# Patient Record
Sex: Female | Born: 1980 | Race: White | Hispanic: No | Smoking: Never smoker
Health system: Southern US, Community
[De-identification: ages and names within clinical notes are randomized; demographics above are authoritative.]

## PROBLEM LIST (undated history)

## (undated) DIAGNOSIS — E079 Disorder of thyroid, unspecified: Secondary | ICD-10-CM

## (undated) DIAGNOSIS — F988 Other specified behavioral and emotional disorders with onset usually occurring in childhood and adolescence: Secondary | ICD-10-CM

## (undated) HISTORY — DX: Other specified behavioral and emotional disorders with onset usually occurring in childhood and adolescence: F98.8

## (undated) HISTORY — PX: ABDOMINAL SURGERY: SHX537

## (undated) HISTORY — PX: CHOLESTEATOMA EXCISION: SHX1345

## (undated) HISTORY — PX: APPENDECTOMY: SHX54

---

## 2012-02-03 HISTORY — PX: TYMPANOSTOMY TUBE PLACEMENT: SHX32

## 2015-05-09 DIAGNOSIS — E039 Hypothyroidism, unspecified: Secondary | ICD-10-CM | POA: Insufficient documentation

## 2015-05-09 DIAGNOSIS — R87810 Cervical high risk human papillomavirus (HPV) DNA test positive: Secondary | ICD-10-CM | POA: Insufficient documentation

## 2015-05-30 DIAGNOSIS — Z8619 Personal history of other infectious and parasitic diseases: Secondary | ICD-10-CM | POA: Insufficient documentation

## 2017-07-20 ENCOUNTER — Other Ambulatory Visit: Payer: Self-pay

## 2017-07-20 ENCOUNTER — Encounter (HOSPITAL_COMMUNITY): Payer: Self-pay | Admitting: Emergency Medicine

## 2017-07-20 ENCOUNTER — Emergency Department (HOSPITAL_COMMUNITY): Payer: Commercial Managed Care - PPO

## 2017-07-20 ENCOUNTER — Emergency Department (HOSPITAL_COMMUNITY)
Admission: EM | Admit: 2017-07-20 | Discharge: 2017-07-21 | Disposition: A | Discharge status: Home / Self Care | Payer: Commercial Managed Care - PPO | Attending: Emergency Medicine | Admitting: Emergency Medicine

## 2017-07-20 DIAGNOSIS — R51 Headache: Secondary | ICD-10-CM | POA: Diagnosis not present

## 2017-07-20 DIAGNOSIS — E079 Disorder of thyroid, unspecified: Secondary | ICD-10-CM | POA: Insufficient documentation

## 2017-07-20 DIAGNOSIS — R519 Headache, unspecified: Secondary | ICD-10-CM

## 2017-07-20 DIAGNOSIS — H5789 Other specified disorders of eye and adnexa: Secondary | ICD-10-CM | POA: Diagnosis present

## 2017-07-20 HISTORY — DX: Disorder of thyroid, unspecified: E07.9

## 2017-07-20 LAB — BASIC METABOLIC PANEL
Anion gap: 11 (ref 5–15)
BUN: 18 mg/dL (ref 6–20)
CO2: 23 mmol/L (ref 22–32)
Calcium: 9.9 mg/dL (ref 8.9–10.3)
Chloride: 103 mmol/L (ref 101–111)
Creatinine, Ser: 0.9 mg/dL (ref 0.44–1.00)
GFR calc non Af Amer: >60 mL/min (ref >60)
Glucose, Bld: 87 mg/dL (ref 65–99)
Potassium: 4 mmol/L (ref 3.5–5.1)
SODIUM: 137 mmol/L (ref 135–145)

## 2017-07-20 LAB — CBC WITH DIFFERENTIAL/PLATELET
Abs Immature Granulocytes: 0 10*3/uL (ref 0.0–0.1)
BASOS PCT: 1 %
Basophils Absolute: 0.1 10*3/uL (ref 0.0–0.1)
Eosinophils Absolute: 0.1 10*3/uL (ref 0.0–0.7)
Eosinophils Relative: 1 %
HCT: 46.2 % — ABNORMAL HIGH (ref 36.0–46.0)
Hemoglobin: 15.6 g/dL — ABNORMAL HIGH (ref 12.0–15.0)
IMMATURE GRANULOCYTES: 0 %
Lymphocytes Relative: 23 %
Lymphs Abs: 2.2 10*3/uL (ref 0.7–4.0)
MCH: 30.7 pg (ref 26.0–34.0)
MCHC: 33.8 g/dL (ref 30.0–36.0)
MCV: 90.9 fL (ref 78.0–100.0)
MONOS PCT: 5 %
Monocytes Absolute: 0.5 10*3/uL (ref 0.1–1.0)
NEUTROS PCT: 70 %
Neutro Abs: 6.7 10*3/uL (ref 1.7–7.7)
PLATELETS: 226 10*3/uL (ref 150–400)
RBC: 5.08 MIL/uL (ref 3.87–5.11)
RDW: 12.3 % (ref 11.5–15.5)
WBC: 9.5 10*3/uL (ref 4.0–10.5)

## 2017-07-20 LAB — I-STAT BETA HCG BLOOD, ED (MC, WL, AP ONLY): I-stat hCG, quantitative: <5 m[IU]/mL (ref <5)

## 2017-07-20 MED ORDER — ACETAMINOPHEN 325 MG PO TABS
325.0000 mg | ORAL_TABLET | Freq: Once | ORAL | Status: AC
  Administered 2017-07-20: 325 mg via ORAL
  Filled 2017-07-20: qty 1

## 2017-07-20 MED ORDER — GADOBENATE DIMEGLUMINE 529 MG/ML IV SOLN
20.0000 mL | Freq: Once | INTRAVENOUS | Status: AC | PRN
  Administered 2017-07-20: 20 mL via INTRAVENOUS

## 2017-07-20 NOTE — ED Notes (Signed)
Pt states she takes Tylenol safely at home. Orders placed.

## 2017-07-20 NOTE — ED Triage Notes (Signed)
Patient sent here from eye dr after her regular check up. Patient has note from eye dr stating that he would like her to be worked up for a central venous sinus thrombosis. Patient c/o of HA, no blurred vision and no other symptoms at this time.

## 2017-07-20 NOTE — ED Provider Notes (Signed)
[facility] EMERGENCY DEPARTMENT Provider Note   CSN: [id number] Arrival date & time: 07/20/17  1759     History   Chief Complaint Chief Complaint  Patient presents with  . Eye Problem    HPI Cathy Franklin is a 37 y.o. female.  Patient with a history of hypothyroidism, recent right tympanostomy, presents with concern for bilateral eye problems after seen by her ophthalmologist earlier today. She went for a routine eye exam with her eye doctor. She denies visual changes, eye pain or headache when seen. She developed a mild headache after arrival in the ED. She was referred to Dr. 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  No recent fever, significant headaches, visual changes, eye pain, nausea, lateralizing weakness, paresthesias.   The history is provided by the patient. No language interpreter was used.  Eye Problem   Pertinent negatives include no photophobia.    Past Medical History:  Diagnosis Date  . Thyroid disease     There are no active problems to display for this patient.      OB History    Gravida  4   Para  4   Term  4   Preterm      AB      Living        SAB      TAB      Ectopic      Multiple      Live Births               Home Medications    Prior to Admission medications   Not on File    Family History No family history on file.  Social History Social History   Tobacco Use  . Smoking status: Never Smoker  . Smokeless tobacco: Never Used  Substance Use Topics  . Alcohol use: Never    Frequency: Never  . Drug use: Never     Allergies   Bupropion and Oxycodone-acetaminophen   Review of Systems Review of Systems  Constitutional: Negative for chills and fever.  HENT: Negative.   Eyes: Negative for photophobia, pain and visual disturbance.  Respiratory: Negative.   Cardiovascular: Negative.    Gastrointestinal: Negative.   Musculoskeletal: Negative.   Skin: Negative.   Neurological: Positive for headaches (Mild headache onset after arrival to ED).     Physical Exam Updated Vital Signs BP (!) 145/102 (BP Location: Right Arm)   Pulse 70   Temp 98.7 F (37.1 C) (Oral)   Resp 18   Ht 5\' 6"  (1.676 m)   Wt 95.3 kg (210 lb)   LMP  (LMP Unknown)   SpO2 99%   BMI 33.89 kg/m   Physical Exam  Constitutional: She is oriented to person, place, and time. She appears well-developed and well-nourished.  Eyes: Pupils are equal, round, and reactive to light. Conjunctivae are normal.  Disc margins hazy c/w edema.  Neck: Normal range of motion.  Pulmonary/Chest: Effort normal.  Neurological: She is alert and oriented to person, place, and time.  CN's 3-12 grossly intact. Speech is clear and focused. No facial asymmetry. No lateralizing weakness. Reflexes are equal. No deficits of coordination. Ambulatory without imbalance.    Skin: Skin is warm and dry.     ED Treatments / Results  Labs (all labs ordered are listed, but only abnormal results are displayed) Labs Reviewed  CBC WITH DIFFERENTIAL/PLATELET - Abnormal; Notable for the following components:      Result Value   Hemoglobin 15.6 (*)    HCT 46.2 (*)    All other components within normal limits  BASIC METABOLIC PANEL  I-STAT BETA HCG BLOOD, ED ([facility], [facility], [facility] ONLY)   Results for orders placed or performed during the hospital encounter of 07/20/17  Basic metabolic panel  Result Value Ref Range   Sodium 137 135 - 145 mmol/L   Potassium 4.0 3.5 - 5.1 mmol/L   Chloride 103 101 - 111 mmol/L   CO2 23 22 - 32 mmol/L   Glucose, Bld 87 65 - 99 mg/dL   BUN 18 6 - 20 mg/dL   Creatinine, Ser 1.61 0.44 - 1.00 mg/dL   Calcium 9.9 8.9 - 09.6 mg/dL   GFR calc non Af Amer >60 >60 mL/min   GFR calc Af Amer >60 >60 mL/min   Anion gap 11 5 - 15  CBC with Differential  Result Value Ref Range   WBC 9.5 4.0 - 10.5 K/uL   RBC 5.08  3.87 - 5.11 MIL/uL   Hemoglobin 15.6 (H) 12.0 - 15.0 g/dL   HCT 04.5 (H) 40.9 - 81.1 %   MCV 90.9 78.0 - 100.0 fL   MCH 30.7 26.0 - 34.0 pg   MCHC 33.8 30.0 - 36.0 g/dL   RDW 91.4 78.2 - 95.6 %   Platelets 226 150 - 400 K/uL   Neutrophils Relative % 70 %   Neutro Abs 6.7 1.7 - 7.7 K/uL   Lymphocytes Relative 23 %   Lymphs Abs 2.2 0.7 - 4.0 K/uL   Monocytes Relative 5 %   Monocytes Absolute 0.5 0.1 - 1.0 K/uL   Eosinophils Relative 1 %   Eosinophils Absolute 0.1 0.0 - 0.7 K/uL   Basophils Relative 1 %   Basophils Absolute 0.1 0.0 - 0.1 K/uL   Immature Granulocytes 0 %   Abs Immature Granulocytes 0.0 0.0 - 0.1 K/uL  I-Stat beta hCG blood, ED  Result Value Ref Range   I-stat hCG, quantitative <5.0 <5 mIU/mL   Comment 3             EKG None  Radiology Mr Laqueta Jean And Wo Contrast  Result Date: 07/20/2017 CLINICAL DATA:  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 EXAM: MRI HEAD AND ORBITS WITHOUT AND WITH CONTRAST MRV HEAD WITHOUT CONTRAST TECHNIQUE: Multiplanar, multiecho pulse sequences of the brain and surrounding structures were obtained without and with intravenous contrast. Multiplanar, multiecho pulse sequences of the orbits and surrounding structures were obtained including fat saturation techniques, before and after intravenous contrast administration. CONTRAST:  20mL MULTIHANCE GADOBENATE DIMEGLUMINE 529 MG/ML IV SOLN COMPARISON:  None. FINDINGS: MRI HEAD FINDINGS Brain: Cerebral volume within normal limits. No focal parenchymal signal abnormality. No abnormal foci of restricted diffusion to suggest acute or subacute ischemia. Gray-white matter differentiation maintained. No areas of chronic infarction. No acute or chronic intracranial hemorrhage. No mass lesion, midline shift or mass effect. No hydrocephalus. No extra-axial fluid collection. 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 Midline structures intact. No abnormal enhancement. Vascular: Major  intracranial vascular flow voids are well maintained. Skull and upper cervical spine: Craniocervical junction within normal limits. Upper cervical spine normal. Bone marrow signal intensity normal. No scalp soft tissue abnormality. Other: Trace fluid noted within the bilateral mastoid air cells, of doubtful significance. Inner ear structures normal. MRI ORBITS FINDINGS Orbits: Globes are symmetric in size with normal  appearance and morphology bilaterally. Optic nerves symmetric and within normal limits. No abnormal optic nerve edema or enhancement. Extra-ocular muscles within normal limits and symmetric in appearance. Superior orbital veins symmetric and within normal limits. Intraconal extraconal fat maintained. Normal lacrimal glands. No abnormality at the orbital apices. No abnormality about the cavernous sinus. Optic chiasm normally position within the suprasellar cistern and is normal in appearance. Visualized sinuses: Visualized sinuses are clear. Soft tissues: Periorbital soft tissues and visualized soft tissues of the face within normal limits. MRV FINDINGS Superior sagittal sinus widely patent to the level of the torcula. Transverse and sigmoid sinuses patent bilaterally as are the proximal internal jugular veins. Left transverse sinus is hypoplastic. Straight sinus, vein of Galen, and internal cerebral veins patent. No evidence for dural sinus thrombosis. No appreciable cortical vein thrombosis on SWI sequences. IMPRESSION: 1. Normal MRI of the brain and orbits. No acute abnormality identified. 2. Normal intracranial MRV.  No evidence for dural sinus thrombosis. Electronically Signed   By: Rise MuBenjamin  McClintock M.D.   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 EXAM: MRI HEAD AND ORBITS WITHOUT AND WITH CONTRAST MRV HEAD WITHOUT CONTRAST TECHNIQUE: Multiplanar, multiecho pulse sequences of the brain and  surrounding structures were obtained without and with intravenous contrast. Multiplanar, multiecho pulse sequences of the orbits and surrounding structures were obtained including fat saturation techniques, before and after intravenous contrast administration. CONTRAST:  20mL MULTIHANCE GADOBENATE DIMEGLUMINE 529 MG/ML IV SOLN COMPARISON:  None. FINDINGS: MRI HEAD FINDINGS Brain: Cerebral volume within normal limits. No focal parenchymal signal abnormality. No abnormal foci of restricted diffusion to suggest acute or subacute ischemia. Gray-white matter differentiation maintained. No areas of chronic infarction. No acute or chronic intracranial hemorrhage. No mass lesion, midline shift or mass effect. No hydrocephalus. No extra-axial fluid collection. 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 Midline structures intact. No abnormal enhancement. Vascular: Major intracranial vascular flow voids are well maintained. Skull and upper cervical spine: Craniocervical junction within normal limits. Upper cervical spine normal. Bone marrow signal intensity normal. No scalp soft tissue abnormality. Other: Trace fluid noted within the bilateral mastoid air cells, of doubtful significance. Inner ear structures normal. MRI ORBITS FINDINGS Orbits: Globes are symmetric in size with normal appearance and morphology bilaterally. Optic nerves symmetric and within normal limits. No abnormal optic nerve edema or enhancement. Extra-ocular muscles within normal limits and symmetric in appearance. Superior orbital veins symmetric and within normal limits. Intraconal extraconal fat maintained. Normal lacrimal glands. No abnormality at the orbital apices. No abnormality about the cavernous sinus. Optic chiasm normally position within the suprasellar cistern and is normal in appearance. Visualized sinuses: Visualized sinuses are clear. Soft tissues: Periorbital soft tissues and visualized soft tissues of the face within normal limits.  MRV FINDINGS Superior sagittal sinus widely patent to the level of the torcula. Transverse and sigmoid sinuses patent bilaterally as are the proximal internal jugular veins. Left transverse sinus is hypoplastic. Straight sinus, vein of Galen, and internal cerebral veins patent. No evidence for dural sinus thrombosis. No appreciable cortical vein thrombosis on SWI sequences. IMPRESSION: 1. Normal MRI of the brain and orbits. No acute abnormality identified. 2. Normal intracranial MRV.  No evidence for dural sinus thrombosis. Electronically Signed   By: Rise MuBenjamin  McClintock M.D.   On: 07/20/2017 22:33   Mr Rockwell GermanyOrbits W ZOWo Contrast  Result Date: 07/20/2017 CLINICAL DATA:  Initial evaluation for new  onset bilateral papilledema. EXAM: MRI HEAD AND ORBITS WITHOUT AND WITH CONTRAST MRV HEAD WITHOUT CONTRAST TECHNIQUE: Multiplanar, multiecho pulse sequences of the brain and surrounding structures were obtained without and with intravenous contrast. Multiplanar, multiecho pulse sequences of the orbits and surrounding structures were obtained including fat saturation techniques, before and after intravenous contrast administration. CONTRAST:  20mL MULTIHANCE GADOBENATE DIMEGLUMINE 529 MG/ML IV SOLN COMPARISON:  None. FINDINGS: MRI HEAD FINDINGS Brain: Cerebral volume within normal limits. No focal parenchymal signal abnormality. No abnormal foci of restricted diffusion to suggest acute or subacute ischemia. Gray-white matter differentiation maintained. No areas of chronic infarction. No acute or chronic intracranial hemorrhage. No mass lesion, midline shift or mass effect. No hydrocephalus. No extra-axial fluid collection. 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 Midline structures intact. No abnormal enhancement. Vascular: Major intracranial vascular flow voids are well maintained. Skull and upper cervical spine: Craniocervical junction within normal limits. Upper cervical spine normal. Bone marrow signal intensity  normal. No scalp soft tissue abnormality. Other: Trace fluid noted within the bilateral mastoid air cells, of doubtful significance. Inner ear structures normal. MRI ORBITS FINDINGS Orbits: Globes are symmetric in size with normal appearance and morphology bilaterally. Optic nerves symmetric and within normal limits. No abnormal optic nerve edema or enhancement. Extra-ocular muscles within normal limits and symmetric in appearance. Superior orbital veins symmetric and within normal limits. Intraconal extraconal fat maintained. Normal lacrimal glands. No abnormality at the orbital apices. No abnormality about the cavernous sinus. Optic chiasm normally position within the suprasellar cistern and is normal in appearance. Visualized sinuses: Visualized sinuses are clear. Soft tissues: Periorbital soft tissues and visualized soft tissues of the face within normal limits. MRV FINDINGS Superior sagittal sinus widely patent to the level of the torcula. Transverse and sigmoid sinuses patent bilaterally as are the proximal internal jugular veins. Left transverse sinus is hypoplastic. Straight sinus, vein of Galen, and internal cerebral veins patent. No evidence for dural sinus thrombosis. No appreciable cortical vein thrombosis on SWI sequences. IMPRESSION: 1. Normal MRI of the brain and orbits. No acute abnormality identified. 2. Normal intracranial MRV.  No evidence for dural sinus thrombosis. Electronically Signed   By: Rise Mu M.D.   On: 07/20/2017 22:33    Procedures Procedures (including critical care time)  Medications Ordered in ED Medications  gadobenate dimeglumine (MULTIHANCE) injection 20 mL (20 mLs Intravenous Contrast Given 07/20/17 2206)  acetaminophen (TYLENOL) tablet 325 mg (325 mg Oral Given 07/20/17 2304)     Initial Impression / Assessment and Plan / ED Course  I have reviewed the triage vital signs and the nursing notes.  Pertinent labs & imaging results that were available  during my care of the patient were reviewed by me and considered in my medical decision making (see chart for details).     Patient here after seeing ophthalmology earlier today with concern for optic edema, sent to r/o CVST and/or pseudotumor.   The patient denies significant headaches. No visual changes. She had MR's as suggested by ophtho (Dr. Sherryll Burger) which are negative.   The patient is seen by Dr. Bebe Shaggy. LP to r/o pseudotumor is not felt necessary based on exam. She is strongly encouraged to follow up with neurology and an ambulatory referral is provided.   The patient is felt appropriate for discharge home.   Final Clinical Impressions(s) / ED Diagnoses   Final diagnoses:  None   1. Headache    ED Discharge Orders    None  Elpidio Anis, PA-C 07/21/17 0013    Zadie Rhine, MD 07/21/17 0800

## 2017-07-20 NOTE — ED Provider Notes (Signed)
Patient placed in Quick Look pathway, seen and evaluated   Chief Complaint: eye problem  HPI:   Patient seen by ophthalmologist Dr. Sherryll BurgerShah earlier today for routine visit. 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 She had recent R ear surgery, there is also concern for mastoiditis, which in turn caused suspicion for CVST. Sends over for MR brain and orbits w/wo, MRV head and possible LP if all of that is negative. She reports headache at this time but that has been going on before the eye doctor evaluated her. Denies any vision changes, fever, pain behind ear, prior neurological issues.  ROS: eye problem  Physical Exam:   Gen: No distress  Neuro: Awake and Alert  Skin: Warm    Focused Exam: PERRL. Tube in R ear. No mastoid TTP.  Imaging ordered per ophtho recommendation. Basic labs ordered as well.   Initiation of care has begun. The patient has been counseled on the process, plan, and necessity for staying for the completion/evaluation, and the remainder of the medical screening examination    Dietrich PatesKhatri, Rheya Minogue, PA-C 07/20/17 1940    Abelino DerrickMackuen, Courteney Lyn, MD 07/20/17 2359

## 2017-07-21 NOTE — Discharge Instructions (Addendum)
Follow up with [facility] for further evaluation in the outpatient setting. Return here with any severe headache, uncontrolled nausea/vomiting, visual problems, or new concerns.

## 2017-07-21 NOTE — ED Provider Notes (Signed)
Patient seen/examined in the Emergency Department in conjunction with Midlevel Provider [facility] Patient presents for evaluation from ophthalmologist. Exam : Awake alert, no distress, no facial droop, no arm drift.  She appears comfortable Plan: Patient reports her headache started after getting her eyes dilated at the ophthalmologist.  No new vision changes at this time.  No focal weakness.  MRI imaging is negative.  Ophthalmologist had requested lumbar puncture for pseudotumor, but after long discussion with patient, she elects to defer this at this time.  She will follow-up with neurology in 1-2 weeks   At this point I feel this is appropriate, there is no emergent need for lumbar puncture at this time    Cathy Franklin, Cathy Runkel, MD 07/21/17 0008

## 2017-07-21 NOTE — ED Notes (Signed)
ED Provider at bedside. 

## 2017-07-22 NOTE — Progress Notes (Signed)
Cathy Franklin was seen today in neurologic consultation at the request of Zadie Rhine, MD.  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  Records have been reviewed, including emergency room records and records from her ophthalmologist at [facility], Dr. Sherryll Burger.  Patient was seen by him on July 20, 2017.  There was noted to be bilateral disc edema without disc hemorrhage.  OCT scanning was negative for intra-or subretinal fluid.  she thinks that she had VF testing at the optometrist.  She was referred to the emergency room after that visit.  Emergency room did MRI of the brain, MRV of the brain and MRI of the orbits.  I had the opportunity to review her scans.  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  They were done with contrast.  Emergency department records indicate that patient was offered lumbar puncture but declined per physician report, but PA who saw her stated "LP to rule out pseudotumor is not felt necessary based on exam."  Pt hasn't noted significant changes in vision.  She does work nights as a Engineer, civil (consulting) and knows that vision is not as good as it used to be.  She is on Vit D.  She is also on a MVI.  She has been on vyvanse for a year.  She was given a progestin challenge to initiate period and that was successful.  No birth control.  IUD out for over a year.  Weight has been stable for the last 2 years.     ALLERGIES:   Allergies  Allergen Reactions  . Bupropion Other (See Comments)    unknown  . Oxycodone-Acetaminophen Other (See Comments)    UNKNOWN    CURRENT MEDICATIONS:  Outpatient Encounter Medications as of 07/23/2017  Medication Sig  . cholecalciferol (VITAMIN D) 1000 units tablet Take 1,000 Units by mouth daily.  Marland Kitchen levothyroxine (SYNTHROID, LEVOTHROID) 75 MCG tablet Take 75 mcg by mouth daily.  Marland Kitchen lisdexamfetamine (VYVANSE) 30 MG  capsule Take 30 mg by mouth daily.  . Multiple Vitamin (MULTIVITAMIN) tablet Take 1 tablet by mouth daily.  . Probiotic Product (PROBIOTIC-10 PO) Take by mouth daily.   No facility-administered encounter medications on file as of 07/23/2017.     PAST MEDICAL HISTORY:   Past Medical History:  Diagnosis Date  . ADD (attention deficit disorder)   . Thyroid disease     PAST SURGICAL HISTORY:   Past Surgical History:  Procedure Laterality Date  . ABDOMINAL SURGERY    . APPENDECTOMY    . CHOLESTEATOMA EXCISION    . TYMPANOSTOMY TUBE PLACEMENT  2014    SOCIAL HISTORY:   Social History   Socioeconomic History  . Marital status: Divorced    Spouse name: Not on file  . Number of children: Not on file  . Years of education: Not on file  . Highest education level: Not on file  Occupational History  . Not on file  Social Needs  . Financial resource strain: Not on file  . Food insecurity:    Worry: Not on file    Inability: Not on file  . Transportation needs:    Medical: Not on file    Non-medical: Not on file  Tobacco Use  . Smoking status: Never Smoker  . Smokeless tobacco: Never Used  Substance and Sexual Activity  . Alcohol use: Never    Frequency: Never  .  Drug use: Never  . Sexual activity: Yes    Birth control/protection: None  Lifestyle  . Physical activity:    Days per week: Not on file    Minutes per session: Not on file  . Stress: Not on file  Relationships  . Social connections:    Talks on phone: Not on file    Gets together: Not on file    Attends religious service: Not on file    Active member of club or organization: Not on file    Attends meetings of clubs or organizations: Not on file    Relationship status: Not on file  . Intimate partner violence:    Fear of current or ex partner: Not on file    Emotionally abused: Not on file    Physically abused: Not on file    Forced sexual activity: Not on file  Other Topics Concern  . Not on file    Social History Narrative  . Not on file    FAMILY HISTORY:   Family Status  Relation Name Status  . Mother  Alive  . Father  Alive  . Sister  Alive  . Child x4 Alive    ROS:  Review of Systems  Constitutional: Negative.   HENT: Negative.   Eyes: Negative.   Respiratory: Negative.   Cardiovascular: Negative.   Gastrointestinal: Negative.   Genitourinary: Negative.   Musculoskeletal: Negative.   Skin: Negative.   Neurological: Negative.   Endo/Heme/Allergies: Negative.     PHYSICAL EXAMINATION:    VITALS:   Vitals:   07/23/17 0947  BP: 110/80  Pulse: 72  SpO2: 98%  Weight: 206 lb (93.4 kg)  Height: 5\' 6"  (1.676 m)    GEN:  Normal appears female in no acute distress.  Appears stated age. HEENT:  Normocephalic, atraumatic. The mucous membranes are moist. The superficial temporal arteries are without ropiness or tenderness. Cardiovascular: Regular rate and rhythm. Lungs: Clear to auscultation bilaterally. Neck/Heme: There are no carotid bruits noted bilaterally.  NEUROLOGICAL: Orientation:  The patient is alert and oriented x 3.  Fund of knowledge is appropriate.  Recent and remote memory intact.  Attention span and concentration normal.  Repeats and names without difficulty. Cranial nerves: There is good facial symmetry. The pupils are equal round and reactive to light bilaterally. Fundoscopic exam reveals blurred disc margins bilaterally. Extraocular muscles are intact and visual fields are full to confrontational testing. Speech is fluent and clear. Soft palate rises symmetrically and there is no tongue deviation. Hearing is intact to conversational tone. Tone: Tone is good throughout. Sensation: Sensation is intact to light touch and pinprick throughout (facial, trunk, extremities). Vibration is intact at the bilateral big toe. There is no extinction with double simultaneous stimulation. There is no sensory dermatomal level identified. Coordination:  The patient has  no difficulty with RAM's or FNF bilaterally. Motor: Strength is 5/5 in the bilateral upper and lower extremities.  Shoulder shrug is equal and symmetric. There is no pronator drift.  There are no fasciculations noted. DTR's: Deep tendon reflexes are 2/4 at the bilateral biceps, triceps, brachioradialis, patella and achilles.  Plantar responses are downgoing bilaterally. Gait and Station: The patient is able to ambulate without difficulty. The patient is able to heel toe walk without any difficulty. The patient is able to ambulate in a tandem fashion. The patient is able to stand in the Romberg position.   IMPRESSION/PLAN  1.  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  She and I discussed risk, benefits, and side effects of lumbar punctures.  She was agreeable.  We will send her for the examination.  -She asked many questions about possible pseudotumor cerebri and I answered them to the best of my ability.  Discussed the value of weight loss, diet and exercise.  Discussed Diamox in detail, as we may start this after her lumbar puncture if pressure is elevated.  -discussed use of birth control and advised not getting pregnant while on medication, if we end up prescribing it.  Discussed that birth control method would need to be barrier method or progestin only pill or copper IUD.    -will try to get VF testing.  -f/u after above completed.    -Much greater than 50% of this visit was spent in counseling and coordinating care.  Total face to face time:  45 min   Cc:  Patient, No Pcp Per

## 2017-07-23 ENCOUNTER — Ambulatory Visit (INDEPENDENT_AMBULATORY_CARE_PROVIDER_SITE_OTHER): Payer: Commercial Managed Care - PPO | Admitting: Neurology

## 2017-07-23 ENCOUNTER — Encounter: Payer: Self-pay | Admitting: Neurology

## 2017-07-23 VITALS — BP 110/80 | HR 72 | Ht 66.0 in | Wt 206.0 lb

## 2017-07-23 DIAGNOSIS — H4710 Unspecified papilledema: Secondary | ICD-10-CM | POA: Diagnosis not present

## 2017-07-23 DIAGNOSIS — E669 Obesity, unspecified: Secondary | ICD-10-CM

## 2017-07-23 NOTE — Patient Instructions (Signed)
1. We have [facility] an [facility] for your [facility]. They will call you directly to schedule. If you do not hear from them they can be reached at [phone].

## 2017-07-30 ENCOUNTER — Telehealth: Payer: Self-pay | Admitting: Neurology

## 2017-07-30 NOTE — Telephone Encounter (Signed)
Records from [facility] [place], [place].  Records state that "screening visual fields" were full but cannot tell if that is just confrontational testing or formal VF testing.  Will you call office and ask if they did VF testing and if so, ask them to send that?  I did not see that.  Thanks.

## 2017-07-30 NOTE — Telephone Encounter (Signed)
Okay, please order through pts ophthalmologist who referred her here, Dr. Sherryll BurgerShah (his referring information in chart)

## 2017-07-30 NOTE — Telephone Encounter (Signed)
I called Dr. Ovidio KinMazzarella's office and was told that they do not do VF testing.

## 2017-08-02 NOTE — Telephone Encounter (Signed)
Order sent to Dr. Sherryll BurgerShah.

## 2017-08-03 ENCOUNTER — Other Ambulatory Visit: Payer: Commercial Managed Care - PPO

## 2017-08-04 ENCOUNTER — Telehealth: Payer: Self-pay | Admitting: Neurology

## 2017-08-04 ENCOUNTER — Ambulatory Visit
Admission: RE | Admit: 2017-08-04 | Discharge: 2017-08-04 | Disposition: A | Discharge status: Home / Self Care | Payer: Commercial Managed Care - PPO | Source: Ambulatory Visit | Attending: Neurology | Admitting: Neurology

## 2017-08-04 ENCOUNTER — Encounter: Payer: Self-pay | Admitting: Radiology

## 2017-08-04 VITALS — BP 118/70 | HR 52

## 2017-08-04 DIAGNOSIS — H4710 Unspecified papilledema: Secondary | ICD-10-CM

## 2017-08-04 LAB — CSF CELL COUNT WITH DIFFERENTIAL
RBC COUNT CSF: 0 {cells}/uL (ref 0–10)
WBC CSF: 1 {cells}/uL (ref 0–5)

## 2017-08-04 LAB — PROTEIN, CSF: TOTAL PROTEIN, CSF: 44 mg/dL (ref 15–45)

## 2017-08-04 LAB — GLUCOSE, CSF: Glucose, CSF: 59 mg/dL (ref 40–80)

## 2017-08-04 MED ORDER — DIAZEPAM 5 MG PO TABS
5.0000 mg | ORAL_TABLET | Freq: Once | ORAL | Status: AC
  Administered 2017-08-04: 5 mg via ORAL

## 2017-08-04 NOTE — Telephone Encounter (Signed)
Please let the patient know that the opening pressure was on the upper limit of normal and this pressure generally doesn't require treatment and doubt is the cause of the papilledema.  Let ophthalmology (Dr. Sherryll BurgerShah) know that OP was normal please and so was MRI/MRV

## 2017-08-04 NOTE — Telephone Encounter (Signed)
Left message on machine for patient to call back.   Results faxed to Dr. Sherryll BurgerShah at [phone].

## 2017-08-04 NOTE — Discharge Instructions (Signed)

## 2017-08-06 NOTE — Telephone Encounter (Signed)
Mychart message sent to patient.

## 2017-08-17 ENCOUNTER — Telehealth: Payer: Self-pay | Admitting: Neurology

## 2017-08-17 NOTE — Telephone Encounter (Signed)
Its fine to follow up with her eye doctor as her LP was normal

## 2017-08-17 NOTE — Telephone Encounter (Signed)
Patient has appt on 11-26-17 and I was calling her to move appt to 11-19-17 and she was wanting to know if she needed to keep this appt. Dr Tat sent her to the eye dr and she is doing good now please advise

## 2017-08-17 NOTE — Telephone Encounter (Signed)
Okay to cancel follow up?

## 2017-08-18 NOTE — Telephone Encounter (Signed)
Cathy Franklin- you can cancel appt and let her know she doesn't need to follow up with us. Thanks!

## 2017-08-18 NOTE — Telephone Encounter (Signed)
Canceled appt and left patient a message to lett her know she did not need to follow up

## 2017-11-26 ENCOUNTER — Ambulatory Visit: Payer: Commercial Managed Care - PPO | Admitting: Neurology

## 2019-11-04 IMAGING — XA DG FLUORO GUIDE LUMBAR PUNCTURE
1 series · 1 of 1 positions shown · non-contrast
Comparison: none

CLINICAL DATA: Papilledema.

[Series 1: ortho standard · 1 of 1 slices shown]
[im 1/1]
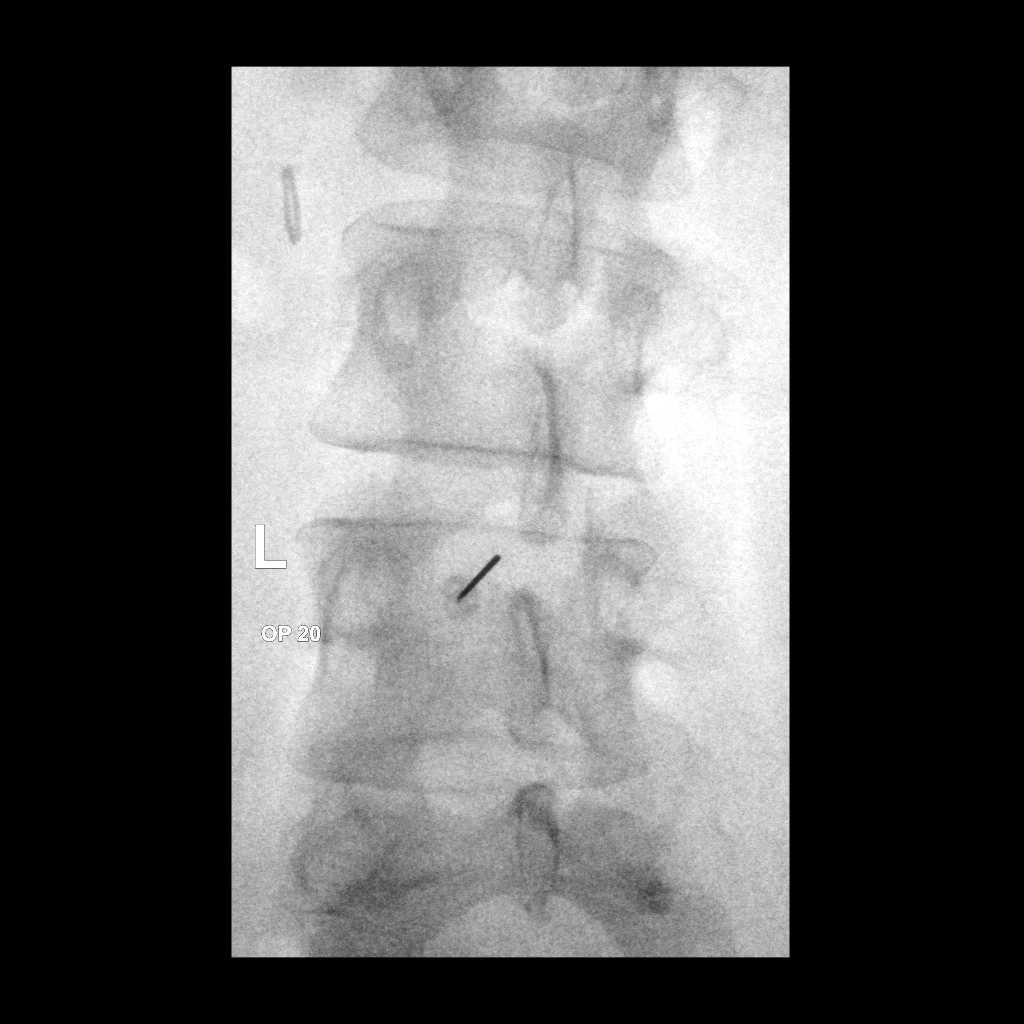

[1 of 1 positions shown; findings below may reference images not displayed]

EXAM:
DIAGNOSTIC LUMBAR PUNCTURE UNDER FLUOROSCOPIC GUIDANCE

FLUOROSCOPY TIME:  Fluoroscopy Time:  3 seconds

Radiation Exposure Index (if provided by the fluoroscopic device):
7.28 microGray*m^2

Number of Acquired Spot Images: 0

PROCEDURE:
Informed consent was obtained from the patient prior to the
procedure, including potential complications of headache, allergy,
and pain. With the patient prone, the lower back was prepped with
Betadine. 1% Lidocaine was used for local anesthesia. Lumbar
puncture was performed at the L3-4 level using a 6 inch 20 gauge
needle via a left paramedian approach with return of clear CSF with
an opening pressure of 20 cm water (measured in the left lateral
decubitus position). 8 mL of CSF were obtained for laboratory
studies. The patient tolerated the procedure well and there were no
apparent complications.
IMPRESSION: Successful fluoroscopic guided lumbar puncture. Opening pressure of
20 cm water.

## 2020-07-02 ENCOUNTER — Other Ambulatory Visit: Payer: Self-pay | Admitting: Sports Medicine

## 2020-07-02 ENCOUNTER — Ambulatory Visit (INDEPENDENT_AMBULATORY_CARE_PROVIDER_SITE_OTHER): Payer: Commercial Managed Care - PPO

## 2020-07-02 ENCOUNTER — Encounter: Payer: Self-pay | Admitting: Sports Medicine

## 2020-07-02 ENCOUNTER — Other Ambulatory Visit: Payer: Self-pay

## 2020-07-02 ENCOUNTER — Ambulatory Visit: Payer: Commercial Managed Care - PPO | Admitting: Sports Medicine

## 2020-07-02 DIAGNOSIS — M79671 Pain in right foot: Secondary | ICD-10-CM | POA: Diagnosis not present

## 2020-07-02 DIAGNOSIS — M722 Plantar fascial fibromatosis: Secondary | ICD-10-CM | POA: Diagnosis not present

## 2020-07-02 DIAGNOSIS — M79672 Pain in left foot: Secondary | ICD-10-CM

## 2020-07-02 DIAGNOSIS — M779 Enthesopathy, unspecified: Secondary | ICD-10-CM

## 2020-07-02 MED ORDER — DICLOFENAC SODIUM 75 MG PO TBEC: 75.0000 mg | DELAYED_RELEASE_TABLET | Freq: Two times a day (BID) | ORAL | 0 refills | Status: AC

## 2020-07-02 MED ORDER — PREDNISONE 10 MG (21) PO TBPK: ORAL_TABLET | ORAL | 0 refills | Status: DC

## 2020-07-02 NOTE — Progress Notes (Signed)
Subjective: Cathy Franklin is a 40 y.o. female patient presents to office with complaint of bilateral foot pain off and on for the last 2 years with the right being the worst with swelling patient reports that the right foot has pain at the medial arch and lateral side shooting in nature 5 out of 10 slowly getting worse reports that pain is worse with the first few steps in the morning.  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  Patient denies any known injury or problems at this time.  Patient does admit that she was treated in the past for plantar fasciitis and giving injections that only helped temporarily.  Review of system noncontributory.   Patient Active Problem List   Diagnosis Date Noted  . 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 Hypothyroidism 05/09/2015  . Pap smear of cervix shows high risk HPV present 05/09/2015    Current Outpatient Medications on File Prior to Visit  Medication Sig Dispense Refill  . cholecalciferol (VITAMIN D) 1000 units tablet Take 1,000 Units by mouth daily.    Marland Kitchen olmesartan-hydrochlorothiazide (BENICAR HCT) 40-12.5 MG tablet Take 1 tablet by mouth daily.    . Probiotic Product (PROBIOTIC-10 PO) Take by mouth daily.    Marland Kitchen SYNTHROID 100 MCG tablet Take 100 mcg by mouth daily.     No current facility-administered medications on file prior to visit.    Allergies  Allergen Reactions  . Bupropion Other (See Comments)    unknown  . Oxycodone-Acetaminophen Other (See Comments)    UNKNOWN    Objective: Physical Exam General: The patient is alert and oriented x3 in no acute distress.  Dermatology: Skin is warm, dry and supple bilateral lower extremities. Nails 1-10 are normal. There is no erythema, localized right foot and ankle edema, no eccymosis, no open lesions present. Integument is otherwise unremarkable.  Vascular: Dorsalis Pedis pulse and Posterior Tibial pulse are  2/4 bilateral. Capillary fill time is immediate to all digits.  Neurological: Grossly intact to light touch bilateral.  Musculoskeletal: Tenderness to palpation at the medial calcaneal tubercale and through the insertion of the plantar fascia on the right greater than left foot.  There is also significant pain over the peroneal tendon course and posterior tibial tendon course extending into the medial arch on the right.  No pain with calf compression bilateral. There is decreased Ankle joint range of motion bilateral. All other joints range of motion within normal limits bilateral. Strength 5/5 in all groups bilateral with mild guarding on right due to pain and swelling.   Gait: Unassisted  Xray, Right/Left foot:  Normal osseous mineralization. Joint spaces preserved. No fracture/dislocation/boney destruction. Calcaneal spur present with mild thickening of plantar fascia. No other soft tissue abnormalities or radiopaque foreign bodies.   Assessment and Plan: Problem List Items Addressed This Visit   None   Visit Diagnoses    Tendonitis    -  Primary   Plantar fasciitis       Bilateral foot pain          -Complete examination performed.  -Xrays reviewed -Discussed with patient in detail the condition of plantar fasciitis is on the right, how this occurs and general treatment options. Explained both conservative and surgical treatments.  -Rx Diclofenac to start after prednisone dose pack is completed -Recommended good supportive shoes and advised use of ASO brace as dispensed this visit for the  right -Explained and dispensed to patient daily stretching exercises for the left only. -Recommend patient to ice affected area 1-2x daily. -Patient to return to office in 3 weeks for follow up or sooner if problems or questions arise.  Asencion Islam, DPM

## 2020-07-08 ENCOUNTER — Telehealth: Payer: Self-pay

## 2020-07-08 NOTE — Telephone Encounter (Signed)
Pt called and LVM stating she has completed her steroid Rx and her pain is no better, it even feels worse. Please advice with further instructions.

## 2020-07-09 NOTE — Telephone Encounter (Signed)
Contacted pt and advised her of Dr. Wynema Birch instructions to continue with diclofenac BID, ice and use cam walker. Pt  Stated understanding

## 2020-07-16 ENCOUNTER — Ambulatory Visit: Payer: Commercial Managed Care - PPO | Admitting: Sports Medicine

## 2020-07-16 ENCOUNTER — Other Ambulatory Visit: Payer: Self-pay

## 2020-07-16 ENCOUNTER — Encounter: Payer: Self-pay | Admitting: Sports Medicine

## 2020-07-16 DIAGNOSIS — M779 Enthesopathy, unspecified: Secondary | ICD-10-CM

## 2020-07-16 DIAGNOSIS — S96911A Strain of unspecified muscle and tendon at ankle and foot level, right foot, initial encounter: Secondary | ICD-10-CM

## 2020-07-16 DIAGNOSIS — M25571 Pain in right ankle and joints of right foot: Secondary | ICD-10-CM

## 2020-07-16 DIAGNOSIS — M722 Plantar fascial fibromatosis: Secondary | ICD-10-CM

## 2020-07-16 DIAGNOSIS — M79671 Pain in right foot: Secondary | ICD-10-CM | POA: Diagnosis not present

## 2020-07-16 MED ORDER — TRAMADOL HCL 50 MG PO TABS: 50.0000 mg | ORAL_TABLET | Freq: Three times a day (TID) | ORAL | 0 refills | Status: AC | PRN

## 2020-07-16 NOTE — Progress Notes (Signed)
Subjective: Cathy Franklin is a 40 y.o. female patient who returns to office for follow up evaluation of Right> Left foot pain. Patient now ranks pain 10/10 without boot and 7/10 with boot and states that the pain got worse went to [place] and could not walk, went to urgent care there and they gave her boot with recommendation to try topical pain rub. Patient denies any other pedal complaints. Denies new trauma or injury. Reports that she was in so much pain had to use wheelchair and can barely do things around her home. Patient is worried about her ability to work due to pain. No other issues noted.  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     diclofenac (VOLTAREN) 75 MG EC tablet Take 1 tablet (75 mg total) by mouth 2 (two) times daily. 30 tablet 0   olmesartan-hydrochlorothiazide (BENICAR HCT) 40-12.5 MG tablet Take 1 tablet by mouth daily.     Probiotic Product (PROBIOTIC-10 PO) Take by mouth daily.     SYNTHROID 100 MCG tablet Take 100 mcg by mouth daily.     No current facility-administered medications on file prior to visit.   Allergies  Allergen Reactions   Bupropion Other (See Comments)    unknown   Oxycodone-Acetaminophen Other (See Comments)    UNKNOWN    Objective:  General: Alert and oriented x3 in no acute distress  Dermatology: No open lesions bilateral lower extremities, no webspace macerations, no ecchymosis bilateral, all nails x 10 are well manicured.  Neurovascular: Intact. No lower extremity edema bilateral.   Musculoskeletal:There is significant tenderness with palpation at Peroneal tendon at level of ankle>plantar fascia/arch> 5th MTPJ>1st MTPJ on right. Tendons  feel intact with palpation. There is guarding due to pain and decreased strength on the right.   Assessment and Plan: Problem List Items Addressed This Visit   None Visit Diagnoses     Right foot pain    -  Primary   Tear of tendon of right ankle, initial encounter       Acute right ankle pain       Tendonitis       Plantar fasciitis            -Complete examination performed -Discussed treatement options for worsening foot and ankle pain  -Rx MRI for further eval to r/o tear since pain is worsening at right foot/ankle -Rx Tramadol -Continue with cam boot -Continue with rest, ice,elevation, and surgigrip compression as dispensed at todays visit -Advised no work until her MRI can be completed -Patient to return to office after MRI or sooner if condition worsens.  Asencion Islam, DPM

## 2020-07-17 ENCOUNTER — Telehealth: Payer: Self-pay

## 2020-07-17 NOTE — Telephone Encounter (Signed)
Contacted patient's [employer] company to obtained authorization for Rt foot and ankle MRI w/o contrast. Authorization was not required  Ref#[id number] Contacted [facility] and scheduled pt's appt for 6-17 for checking in at 4:15 for a 4:30 appt.  Contacted pt and advised her of her appt date and time, pt was instructed of location address and phone number, pt stated understanding

## 2020-07-24 ENCOUNTER — Ambulatory Visit: Payer: Commercial Managed Care - PPO | Admitting: Sports Medicine

## 2021-09-22 DIAGNOSIS — Z1231 Encounter for screening mammogram for malignant neoplasm of breast: Secondary | ICD-10-CM | POA: Diagnosis not present

## 2021-10-31 DIAGNOSIS — M9901 Segmental and somatic dysfunction of cervical region: Secondary | ICD-10-CM | POA: Diagnosis not present

## 2021-10-31 DIAGNOSIS — M7912 Myalgia of auxiliary muscles, head and neck: Secondary | ICD-10-CM | POA: Diagnosis not present

## 2021-10-31 DIAGNOSIS — M5383 Other specified dorsopathies, cervicothoracic region: Secondary | ICD-10-CM | POA: Diagnosis not present

## 2021-10-31 DIAGNOSIS — M9902 Segmental and somatic dysfunction of thoracic region: Secondary | ICD-10-CM | POA: Diagnosis not present

## 2021-11-03 DIAGNOSIS — M5383 Other specified dorsopathies, cervicothoracic region: Secondary | ICD-10-CM | POA: Diagnosis not present

## 2021-11-03 DIAGNOSIS — M9901 Segmental and somatic dysfunction of cervical region: Secondary | ICD-10-CM | POA: Diagnosis not present

## 2021-11-03 DIAGNOSIS — M9902 Segmental and somatic dysfunction of thoracic region: Secondary | ICD-10-CM | POA: Diagnosis not present

## 2021-11-03 DIAGNOSIS — M7912 Myalgia of auxiliary muscles, head and neck: Secondary | ICD-10-CM | POA: Diagnosis not present

## 2021-11-13 DIAGNOSIS — M5383 Other specified dorsopathies, cervicothoracic region: Secondary | ICD-10-CM | POA: Diagnosis not present

## 2021-11-13 DIAGNOSIS — M9901 Segmental and somatic dysfunction of cervical region: Secondary | ICD-10-CM | POA: Diagnosis not present

## 2021-11-13 DIAGNOSIS — M7912 Myalgia of auxiliary muscles, head and neck: Secondary | ICD-10-CM | POA: Diagnosis not present

## 2021-11-13 DIAGNOSIS — M9902 Segmental and somatic dysfunction of thoracic region: Secondary | ICD-10-CM | POA: Diagnosis not present

## 2021-11-28 DIAGNOSIS — M9902 Segmental and somatic dysfunction of thoracic region: Secondary | ICD-10-CM | POA: Diagnosis not present

## 2021-11-28 DIAGNOSIS — M5383 Other specified dorsopathies, cervicothoracic region: Secondary | ICD-10-CM | POA: Diagnosis not present

## 2021-11-28 DIAGNOSIS — M7912 Myalgia of auxiliary muscles, head and neck: Secondary | ICD-10-CM | POA: Diagnosis not present

## 2021-11-28 DIAGNOSIS — M9901 Segmental and somatic dysfunction of cervical region: Secondary | ICD-10-CM | POA: Diagnosis not present

## 2021-12-01 DIAGNOSIS — E039 Hypothyroidism, unspecified: Secondary | ICD-10-CM | POA: Diagnosis not present

## 2021-12-01 DIAGNOSIS — Z6831 Body mass index (BMI) 31.0-31.9, adult: Secondary | ICD-10-CM | POA: Diagnosis not present

## 2021-12-01 DIAGNOSIS — E669 Obesity, unspecified: Secondary | ICD-10-CM | POA: Diagnosis not present

## 2021-12-01 DIAGNOSIS — I10 Essential (primary) hypertension: Secondary | ICD-10-CM | POA: Diagnosis not present

## 2021-12-11 DIAGNOSIS — M7912 Myalgia of auxiliary muscles, head and neck: Secondary | ICD-10-CM | POA: Diagnosis not present

## 2021-12-11 DIAGNOSIS — M9901 Segmental and somatic dysfunction of cervical region: Secondary | ICD-10-CM | POA: Diagnosis not present

## 2021-12-11 DIAGNOSIS — M9902 Segmental and somatic dysfunction of thoracic region: Secondary | ICD-10-CM | POA: Diagnosis not present

## 2021-12-11 DIAGNOSIS — M5383 Other specified dorsopathies, cervicothoracic region: Secondary | ICD-10-CM | POA: Diagnosis not present

## 2021-12-15 DIAGNOSIS — Z1151 Encounter for screening for human papillomavirus (HPV): Secondary | ICD-10-CM | POA: Diagnosis not present

## 2021-12-15 DIAGNOSIS — Z01419 Encounter for gynecological examination (general) (routine) without abnormal findings: Secondary | ICD-10-CM | POA: Diagnosis not present

## 2021-12-18 DIAGNOSIS — H6993 Unspecified Eustachian tube disorder, bilateral: Secondary | ICD-10-CM | POA: Diagnosis not present

## 2021-12-18 DIAGNOSIS — H95191 Other disorders following mastoidectomy, right ear: Secondary | ICD-10-CM | POA: Diagnosis not present

## 2021-12-18 DIAGNOSIS — H9071 Mixed conductive and sensorineural hearing loss, unilateral, right ear, with unrestricted hearing on the contralateral side: Secondary | ICD-10-CM | POA: Diagnosis not present

## 2021-12-18 DIAGNOSIS — Z9089 Acquired absence of other organs: Secondary | ICD-10-CM | POA: Diagnosis not present

## 2021-12-18 DIAGNOSIS — H7201 Central perforation of tympanic membrane, right ear: Secondary | ICD-10-CM | POA: Diagnosis not present

## 2021-12-22 DIAGNOSIS — M7912 Myalgia of auxiliary muscles, head and neck: Secondary | ICD-10-CM | POA: Diagnosis not present

## 2021-12-22 DIAGNOSIS — M5383 Other specified dorsopathies, cervicothoracic region: Secondary | ICD-10-CM | POA: Diagnosis not present

## 2021-12-22 DIAGNOSIS — M9901 Segmental and somatic dysfunction of cervical region: Secondary | ICD-10-CM | POA: Diagnosis not present

## 2021-12-22 DIAGNOSIS — M9902 Segmental and somatic dysfunction of thoracic region: Secondary | ICD-10-CM | POA: Diagnosis not present

## 2022-01-15 DIAGNOSIS — F4320 Adjustment disorder, unspecified: Secondary | ICD-10-CM | POA: Diagnosis not present

## 2022-01-19 DIAGNOSIS — R1011 Right upper quadrant pain: Secondary | ICD-10-CM | POA: Diagnosis not present

## 2022-01-19 DIAGNOSIS — F4320 Adjustment disorder, unspecified: Secondary | ICD-10-CM | POA: Diagnosis not present

## 2022-01-19 DIAGNOSIS — R109 Unspecified abdominal pain: Secondary | ICD-10-CM | POA: Diagnosis not present

## 2022-01-20 DIAGNOSIS — R109 Unspecified abdominal pain: Secondary | ICD-10-CM | POA: Diagnosis not present

## 2022-02-06 DIAGNOSIS — F4320 Adjustment disorder, unspecified: Secondary | ICD-10-CM | POA: Diagnosis not present

## 2022-02-26 DIAGNOSIS — F4320 Adjustment disorder, unspecified: Secondary | ICD-10-CM | POA: Diagnosis not present

## 2022-03-05 DIAGNOSIS — F4320 Adjustment disorder, unspecified: Secondary | ICD-10-CM | POA: Diagnosis not present

## 2022-03-12 DIAGNOSIS — F4320 Adjustment disorder, unspecified: Secondary | ICD-10-CM | POA: Diagnosis not present

## 2022-03-16 DIAGNOSIS — F4320 Adjustment disorder, unspecified: Secondary | ICD-10-CM | POA: Diagnosis not present

## 2022-04-02 DIAGNOSIS — F4320 Adjustment disorder, unspecified: Secondary | ICD-10-CM | POA: Diagnosis not present

## 2022-04-16 DIAGNOSIS — F4320 Adjustment disorder, unspecified: Secondary | ICD-10-CM | POA: Diagnosis not present

## 2022-04-23 DIAGNOSIS — F4320 Adjustment disorder, unspecified: Secondary | ICD-10-CM | POA: Diagnosis not present

## 2022-04-27 DIAGNOSIS — F4320 Adjustment disorder, unspecified: Secondary | ICD-10-CM | POA: Diagnosis not present

## 2022-05-28 DIAGNOSIS — F4320 Adjustment disorder, unspecified: Secondary | ICD-10-CM | POA: Diagnosis not present

## 2022-06-04 DIAGNOSIS — F4320 Adjustment disorder, unspecified: Secondary | ICD-10-CM | POA: Diagnosis not present

## 2022-06-12 DIAGNOSIS — F4320 Adjustment disorder, unspecified: Secondary | ICD-10-CM | POA: Diagnosis not present

## 2022-06-18 DIAGNOSIS — Z9089 Acquired absence of other organs: Secondary | ICD-10-CM | POA: Diagnosis not present

## 2022-06-18 DIAGNOSIS — H6993 Unspecified Eustachian tube disorder, bilateral: Secondary | ICD-10-CM | POA: Diagnosis not present

## 2022-06-18 DIAGNOSIS — H7201 Central perforation of tympanic membrane, right ear: Secondary | ICD-10-CM | POA: Diagnosis not present

## 2022-06-18 DIAGNOSIS — H9071 Mixed conductive and sensorineural hearing loss, unilateral, right ear, with unrestricted hearing on the contralateral side: Secondary | ICD-10-CM | POA: Diagnosis not present

## 2022-06-19 DIAGNOSIS — F4320 Adjustment disorder, unspecified: Secondary | ICD-10-CM | POA: Diagnosis not present

## 2022-07-02 DIAGNOSIS — F4320 Adjustment disorder, unspecified: Secondary | ICD-10-CM | POA: Diagnosis not present

## 2022-07-10 DIAGNOSIS — F4320 Adjustment disorder, unspecified: Secondary | ICD-10-CM | POA: Diagnosis not present

## 2022-07-13 ENCOUNTER — Other Ambulatory Visit (HOSPITAL_COMMUNITY): Payer: Self-pay

## 2022-07-13 DIAGNOSIS — Z6832 Body mass index (BMI) 32.0-32.9, adult: Secondary | ICD-10-CM | POA: Diagnosis not present

## 2022-07-13 DIAGNOSIS — E039 Hypothyroidism, unspecified: Secondary | ICD-10-CM | POA: Diagnosis not present

## 2022-07-13 DIAGNOSIS — Z713 Dietary counseling and surveillance: Secondary | ICD-10-CM | POA: Diagnosis not present

## 2022-07-13 DIAGNOSIS — E669 Obesity, unspecified: Secondary | ICD-10-CM | POA: Diagnosis not present

## 2022-07-13 MED ORDER — ZEPBOUND 2.5 MG/0.5ML ~~LOC~~ SOAJ
2.5000 mg | SUBCUTANEOUS | 0 refills | Status: DC
  Filled 2022-07-13: qty 2, 28d supply, fill #0

## 2022-07-14 ENCOUNTER — Other Ambulatory Visit: Payer: Self-pay

## 2022-07-14 ENCOUNTER — Other Ambulatory Visit (HOSPITAL_COMMUNITY): Payer: Self-pay

## 2022-07-24 DIAGNOSIS — F4320 Adjustment disorder, unspecified: Secondary | ICD-10-CM | POA: Diagnosis not present

## 2022-08-03 ENCOUNTER — Other Ambulatory Visit (HOSPITAL_COMMUNITY): Payer: Self-pay

## 2022-08-03 MED ORDER — TIRZEPATIDE-WEIGHT MANAGEMENT 2.5 MG/0.5ML ~~LOC~~ SOAJ
2.5000 mg | SUBCUTANEOUS | 0 refills | Status: AC
  Filled 2022-08-03: qty 2, 28d supply, fill #0

## 2022-08-04 ENCOUNTER — Encounter (HOSPITAL_COMMUNITY): Payer: Self-pay

## 2022-08-04 ENCOUNTER — Other Ambulatory Visit (HOSPITAL_COMMUNITY): Payer: Self-pay

## 2022-08-10 ENCOUNTER — Other Ambulatory Visit: Payer: Self-pay

## 2022-08-10 ENCOUNTER — Other Ambulatory Visit (HOSPITAL_COMMUNITY): Payer: Self-pay

## 2022-08-10 DIAGNOSIS — Z0000 Encounter for general adult medical examination without abnormal findings: Secondary | ICD-10-CM | POA: Diagnosis not present

## 2022-08-10 DIAGNOSIS — Z6831 Body mass index (BMI) 31.0-31.9, adult: Secondary | ICD-10-CM | POA: Diagnosis not present

## 2022-08-10 DIAGNOSIS — E669 Obesity, unspecified: Secondary | ICD-10-CM | POA: Diagnosis not present

## 2022-08-11 ENCOUNTER — Other Ambulatory Visit (HOSPITAL_COMMUNITY): Payer: Self-pay

## 2022-08-11 MED ORDER — ZEPBOUND 5 MG/0.5ML ~~LOC~~ SOAJ
SUBCUTANEOUS | 0 refills | Status: AC
  Filled 2022-08-11: qty 2, 28d supply, fill #0

## 2022-08-12 ENCOUNTER — Other Ambulatory Visit (HOSPITAL_COMMUNITY): Payer: Self-pay

## 2022-08-12 MED ORDER — ZEPBOUND 5 MG/0.5ML ~~LOC~~ SOAJ
SUBCUTANEOUS | 0 refills | Status: AC
  Filled 2022-08-12: qty 2, 28d supply, fill #0

## 2022-08-13 ENCOUNTER — Other Ambulatory Visit (HOSPITAL_COMMUNITY): Payer: Self-pay

## 2022-08-17 DIAGNOSIS — Z461 Encounter for fitting and adjustment of hearing aid: Secondary | ICD-10-CM | POA: Diagnosis not present

## 2022-08-17 DIAGNOSIS — H9191 Unspecified hearing loss, right ear: Secondary | ICD-10-CM | POA: Diagnosis not present

## 2022-08-17 DIAGNOSIS — H9190 Unspecified hearing loss, unspecified ear: Secondary | ICD-10-CM | POA: Diagnosis not present

## 2022-08-17 DIAGNOSIS — Z9089 Acquired absence of other organs: Secondary | ICD-10-CM | POA: Diagnosis not present

## 2022-08-17 DIAGNOSIS — H9311 Tinnitus, right ear: Secondary | ICD-10-CM | POA: Diagnosis not present

## 2022-08-18 ENCOUNTER — Other Ambulatory Visit (HOSPITAL_COMMUNITY): Payer: Self-pay

## 2022-08-19 ENCOUNTER — Other Ambulatory Visit (HOSPITAL_COMMUNITY): Payer: Self-pay

## 2022-08-25 DIAGNOSIS — H6691 Otitis media, unspecified, right ear: Secondary | ICD-10-CM | POA: Diagnosis not present

## 2022-09-07 DIAGNOSIS — E669 Obesity, unspecified: Secondary | ICD-10-CM | POA: Diagnosis not present

## 2022-09-07 DIAGNOSIS — Z713 Dietary counseling and surveillance: Secondary | ICD-10-CM | POA: Diagnosis not present

## 2022-09-07 DIAGNOSIS — Z6830 Body mass index (BMI) 30.0-30.9, adult: Secondary | ICD-10-CM | POA: Diagnosis not present

## 2022-09-11 DIAGNOSIS — H6993 Unspecified Eustachian tube disorder, bilateral: Secondary | ICD-10-CM | POA: Diagnosis not present

## 2022-09-11 DIAGNOSIS — Z9089 Acquired absence of other organs: Secondary | ICD-10-CM | POA: Diagnosis not present

## 2022-09-11 DIAGNOSIS — H7201 Central perforation of tympanic membrane, right ear: Secondary | ICD-10-CM | POA: Diagnosis not present

## 2022-09-11 DIAGNOSIS — H9071 Mixed conductive and sensorineural hearing loss, unilateral, right ear, with unrestricted hearing on the contralateral side: Secondary | ICD-10-CM | POA: Diagnosis not present

## 2022-09-18 DIAGNOSIS — F4320 Adjustment disorder, unspecified: Secondary | ICD-10-CM | POA: Diagnosis not present

## 2022-09-25 DIAGNOSIS — Z1231 Encounter for screening mammogram for malignant neoplasm of breast: Secondary | ICD-10-CM | POA: Diagnosis not present

## 2022-09-25 DIAGNOSIS — F4320 Adjustment disorder, unspecified: Secondary | ICD-10-CM | POA: Diagnosis not present

## 2022-10-06 DIAGNOSIS — E669 Obesity, unspecified: Secondary | ICD-10-CM | POA: Diagnosis not present

## 2022-10-06 DIAGNOSIS — Z6830 Body mass index (BMI) 30.0-30.9, adult: Secondary | ICD-10-CM | POA: Diagnosis not present

## 2022-10-06 DIAGNOSIS — Z713 Dietary counseling and surveillance: Secondary | ICD-10-CM | POA: Diagnosis not present

## 2022-10-16 DIAGNOSIS — F4320 Adjustment disorder, unspecified: Secondary | ICD-10-CM | POA: Diagnosis not present

## 2022-10-26 DIAGNOSIS — D485 Neoplasm of uncertain behavior of skin: Secondary | ICD-10-CM | POA: Diagnosis not present

## 2022-10-26 DIAGNOSIS — D225 Melanocytic nevi of trunk: Secondary | ICD-10-CM | POA: Diagnosis not present

## 2022-10-26 DIAGNOSIS — L814 Other melanin hyperpigmentation: Secondary | ICD-10-CM | POA: Diagnosis not present

## 2022-10-26 DIAGNOSIS — L821 Other seborrheic keratosis: Secondary | ICD-10-CM | POA: Diagnosis not present

## 2022-10-27 DIAGNOSIS — F4320 Adjustment disorder, unspecified: Secondary | ICD-10-CM | POA: Diagnosis not present

## 2022-11-03 DIAGNOSIS — Z713 Dietary counseling and surveillance: Secondary | ICD-10-CM | POA: Diagnosis not present

## 2022-11-03 DIAGNOSIS — Z6828 Body mass index (BMI) 28.0-28.9, adult: Secondary | ICD-10-CM | POA: Diagnosis not present

## 2022-11-03 DIAGNOSIS — E663 Overweight: Secondary | ICD-10-CM | POA: Diagnosis not present

## 2022-11-10 DIAGNOSIS — F4320 Adjustment disorder, unspecified: Secondary | ICD-10-CM | POA: Diagnosis not present

## 2022-12-04 DIAGNOSIS — F4320 Adjustment disorder, unspecified: Secondary | ICD-10-CM | POA: Diagnosis not present

## 2022-12-18 DIAGNOSIS — F4320 Adjustment disorder, unspecified: Secondary | ICD-10-CM | POA: Diagnosis not present

## 2022-12-25 DIAGNOSIS — Z01419 Encounter for gynecological examination (general) (routine) without abnormal findings: Secondary | ICD-10-CM | POA: Diagnosis not present

## 2023-01-04 DIAGNOSIS — Z713 Dietary counseling and surveillance: Secondary | ICD-10-CM | POA: Diagnosis not present

## 2023-01-04 DIAGNOSIS — Z6828 Body mass index (BMI) 28.0-28.9, adult: Secondary | ICD-10-CM | POA: Diagnosis not present

## 2023-01-04 DIAGNOSIS — E663 Overweight: Secondary | ICD-10-CM | POA: Diagnosis not present

## 2023-01-04 DIAGNOSIS — E039 Hypothyroidism, unspecified: Secondary | ICD-10-CM | POA: Diagnosis not present

## 2023-01-11 DIAGNOSIS — F4320 Adjustment disorder, unspecified: Secondary | ICD-10-CM | POA: Diagnosis not present

## 2023-02-04 DIAGNOSIS — R1011 Right upper quadrant pain: Secondary | ICD-10-CM | POA: Diagnosis not present

## 2023-02-04 DIAGNOSIS — R1084 Generalized abdominal pain: Secondary | ICD-10-CM | POA: Diagnosis not present

## 2023-02-05 DIAGNOSIS — R1011 Right upper quadrant pain: Secondary | ICD-10-CM | POA: Diagnosis not present

## 2023-02-05 DIAGNOSIS — K8050 Calculus of bile duct without cholangitis or cholecystitis without obstruction: Secondary | ICD-10-CM | POA: Diagnosis not present

## 2023-02-05 DIAGNOSIS — K8010 Calculus of gallbladder with chronic cholecystitis without obstruction: Secondary | ICD-10-CM | POA: Diagnosis not present

## 2023-02-09 DIAGNOSIS — Z888 Allergy status to other drugs, medicaments and biological substances status: Secondary | ICD-10-CM | POA: Diagnosis not present

## 2023-02-09 DIAGNOSIS — Z79899 Other long term (current) drug therapy: Secondary | ICD-10-CM | POA: Diagnosis not present

## 2023-02-09 DIAGNOSIS — Z885 Allergy status to narcotic agent status: Secondary | ICD-10-CM | POA: Diagnosis not present

## 2023-02-09 DIAGNOSIS — K8010 Calculus of gallbladder with chronic cholecystitis without obstruction: Secondary | ICD-10-CM | POA: Diagnosis not present

## 2023-02-09 DIAGNOSIS — Z87442 Personal history of urinary calculi: Secondary | ICD-10-CM | POA: Diagnosis not present

## 2023-02-25 DIAGNOSIS — Z09 Encounter for follow-up examination after completed treatment for conditions other than malignant neoplasm: Secondary | ICD-10-CM | POA: Diagnosis not present

## 2023-02-25 DIAGNOSIS — K8010 Calculus of gallbladder with chronic cholecystitis without obstruction: Secondary | ICD-10-CM | POA: Diagnosis not present

## 2023-02-26 DIAGNOSIS — K8010 Calculus of gallbladder with chronic cholecystitis without obstruction: Secondary | ICD-10-CM | POA: Diagnosis not present

## 2023-06-17 DIAGNOSIS — S52601A Unspecified fracture of lower end of right ulna, initial encounter for closed fracture: Secondary | ICD-10-CM | POA: Diagnosis not present

## 2023-06-17 DIAGNOSIS — S62001A Unspecified fracture of navicular [scaphoid] bone of right wrist, initial encounter for closed fracture: Secondary | ICD-10-CM | POA: Diagnosis not present

## 2023-06-17 DIAGNOSIS — S52611A Displaced fracture of right ulna styloid process, initial encounter for closed fracture: Secondary | ICD-10-CM | POA: Diagnosis not present

## 2023-06-17 DIAGNOSIS — S52591A Other fractures of lower end of right radius, initial encounter for closed fracture: Secondary | ICD-10-CM | POA: Diagnosis not present

## 2023-06-17 DIAGNOSIS — M25531 Pain in right wrist: Secondary | ICD-10-CM | POA: Diagnosis not present

## 2023-06-17 DIAGNOSIS — S52501A Unspecified fracture of the lower end of right radius, initial encounter for closed fracture: Secondary | ICD-10-CM | POA: Diagnosis not present

## 2023-06-17 DIAGNOSIS — W19XXXA Unspecified fall, initial encounter: Secondary | ICD-10-CM | POA: Diagnosis not present

## 2023-06-21 DIAGNOSIS — S52531A Colles' fracture of right radius, initial encounter for closed fracture: Secondary | ICD-10-CM | POA: Diagnosis not present

## 2023-06-29 DIAGNOSIS — S52531A Colles' fracture of right radius, initial encounter for closed fracture: Secondary | ICD-10-CM | POA: Diagnosis not present

## 2023-07-05 DIAGNOSIS — S52531A Colles' fracture of right radius, initial encounter for closed fracture: Secondary | ICD-10-CM | POA: Diagnosis not present

## 2023-07-06 DIAGNOSIS — E663 Overweight: Secondary | ICD-10-CM | POA: Diagnosis not present

## 2023-07-06 DIAGNOSIS — E039 Hypothyroidism, unspecified: Secondary | ICD-10-CM | POA: Diagnosis not present

## 2023-07-06 DIAGNOSIS — Z6828 Body mass index (BMI) 28.0-28.9, adult: Secondary | ICD-10-CM | POA: Diagnosis not present

## 2023-07-22 DIAGNOSIS — S52531A Colles' fracture of right radius, initial encounter for closed fracture: Secondary | ICD-10-CM | POA: Diagnosis not present

## 2023-08-11 DIAGNOSIS — M25631 Stiffness of right wrist, not elsewhere classified: Secondary | ICD-10-CM | POA: Diagnosis not present

## 2023-08-11 DIAGNOSIS — M6281 Muscle weakness (generalized): Secondary | ICD-10-CM | POA: Diagnosis not present

## 2023-08-11 DIAGNOSIS — M25531 Pain in right wrist: Secondary | ICD-10-CM | POA: Diagnosis not present

## 2023-08-11 DIAGNOSIS — S52531D Colles' fracture of right radius, subsequent encounter for closed fracture with routine healing: Secondary | ICD-10-CM | POA: Diagnosis not present

## 2023-08-13 DIAGNOSIS — M6281 Muscle weakness (generalized): Secondary | ICD-10-CM | POA: Diagnosis not present

## 2023-08-13 DIAGNOSIS — M25631 Stiffness of right wrist, not elsewhere classified: Secondary | ICD-10-CM | POA: Diagnosis not present

## 2023-08-13 DIAGNOSIS — M25531 Pain in right wrist: Secondary | ICD-10-CM | POA: Diagnosis not present

## 2023-08-13 DIAGNOSIS — S52531D Colles' fracture of right radius, subsequent encounter for closed fracture with routine healing: Secondary | ICD-10-CM | POA: Diagnosis not present

## 2023-08-16 DIAGNOSIS — Z6828 Body mass index (BMI) 28.0-28.9, adult: Secondary | ICD-10-CM | POA: Diagnosis not present

## 2023-08-16 DIAGNOSIS — Z0000 Encounter for general adult medical examination without abnormal findings: Secondary | ICD-10-CM | POA: Diagnosis not present

## 2023-08-16 DIAGNOSIS — E663 Overweight: Secondary | ICD-10-CM | POA: Diagnosis not present

## 2023-08-17 DIAGNOSIS — M25631 Stiffness of right wrist, not elsewhere classified: Secondary | ICD-10-CM | POA: Diagnosis not present

## 2023-08-17 DIAGNOSIS — M6281 Muscle weakness (generalized): Secondary | ICD-10-CM | POA: Diagnosis not present

## 2023-08-17 DIAGNOSIS — S52531D Colles' fracture of right radius, subsequent encounter for closed fracture with routine healing: Secondary | ICD-10-CM | POA: Diagnosis not present

## 2023-08-17 DIAGNOSIS — M25531 Pain in right wrist: Secondary | ICD-10-CM | POA: Diagnosis not present

## 2023-08-24 DIAGNOSIS — M25631 Stiffness of right wrist, not elsewhere classified: Secondary | ICD-10-CM | POA: Diagnosis not present

## 2023-08-24 DIAGNOSIS — S52531D Colles' fracture of right radius, subsequent encounter for closed fracture with routine healing: Secondary | ICD-10-CM | POA: Diagnosis not present

## 2023-08-24 DIAGNOSIS — M6281 Muscle weakness (generalized): Secondary | ICD-10-CM | POA: Diagnosis not present

## 2023-08-24 DIAGNOSIS — M25531 Pain in right wrist: Secondary | ICD-10-CM | POA: Diagnosis not present

## 2023-08-25 DIAGNOSIS — M25531 Pain in right wrist: Secondary | ICD-10-CM | POA: Diagnosis not present

## 2023-08-25 DIAGNOSIS — S52531D Colles' fracture of right radius, subsequent encounter for closed fracture with routine healing: Secondary | ICD-10-CM | POA: Diagnosis not present

## 2023-08-25 DIAGNOSIS — M6281 Muscle weakness (generalized): Secondary | ICD-10-CM | POA: Diagnosis not present

## 2023-08-25 DIAGNOSIS — M25631 Stiffness of right wrist, not elsewhere classified: Secondary | ICD-10-CM | POA: Diagnosis not present

## 2023-09-02 DIAGNOSIS — M25531 Pain in right wrist: Secondary | ICD-10-CM | POA: Diagnosis not present

## 2023-09-02 DIAGNOSIS — M6281 Muscle weakness (generalized): Secondary | ICD-10-CM | POA: Diagnosis not present

## 2023-09-02 DIAGNOSIS — S52531D Colles' fracture of right radius, subsequent encounter for closed fracture with routine healing: Secondary | ICD-10-CM | POA: Diagnosis not present

## 2023-09-02 DIAGNOSIS — M25631 Stiffness of right wrist, not elsewhere classified: Secondary | ICD-10-CM | POA: Diagnosis not present

## 2023-09-08 DIAGNOSIS — S52531D Colles' fracture of right radius, subsequent encounter for closed fracture with routine healing: Secondary | ICD-10-CM | POA: Diagnosis not present

## 2023-09-08 DIAGNOSIS — M6281 Muscle weakness (generalized): Secondary | ICD-10-CM | POA: Diagnosis not present

## 2023-09-08 DIAGNOSIS — M25531 Pain in right wrist: Secondary | ICD-10-CM | POA: Diagnosis not present

## 2023-09-08 DIAGNOSIS — M25631 Stiffness of right wrist, not elsewhere classified: Secondary | ICD-10-CM | POA: Diagnosis not present

## 2023-09-09 DIAGNOSIS — M25631 Stiffness of right wrist, not elsewhere classified: Secondary | ICD-10-CM | POA: Diagnosis not present

## 2023-09-09 DIAGNOSIS — M25531 Pain in right wrist: Secondary | ICD-10-CM | POA: Diagnosis not present

## 2023-09-09 DIAGNOSIS — M6281 Muscle weakness (generalized): Secondary | ICD-10-CM | POA: Diagnosis not present

## 2023-09-09 DIAGNOSIS — S52531D Colles' fracture of right radius, subsequent encounter for closed fracture with routine healing: Secondary | ICD-10-CM | POA: Diagnosis not present

## 2023-09-13 DIAGNOSIS — S52531D Colles' fracture of right radius, subsequent encounter for closed fracture with routine healing: Secondary | ICD-10-CM | POA: Diagnosis not present

## 2023-09-13 DIAGNOSIS — M25631 Stiffness of right wrist, not elsewhere classified: Secondary | ICD-10-CM | POA: Diagnosis not present

## 2023-09-13 DIAGNOSIS — M6281 Muscle weakness (generalized): Secondary | ICD-10-CM | POA: Diagnosis not present

## 2023-09-13 DIAGNOSIS — M25531 Pain in right wrist: Secondary | ICD-10-CM | POA: Diagnosis not present

## 2023-09-15 DIAGNOSIS — S52531D Colles' fracture of right radius, subsequent encounter for closed fracture with routine healing: Secondary | ICD-10-CM | POA: Diagnosis not present

## 2023-09-15 DIAGNOSIS — M25631 Stiffness of right wrist, not elsewhere classified: Secondary | ICD-10-CM | POA: Diagnosis not present

## 2023-09-15 DIAGNOSIS — M6281 Muscle weakness (generalized): Secondary | ICD-10-CM | POA: Diagnosis not present

## 2023-09-15 DIAGNOSIS — M25531 Pain in right wrist: Secondary | ICD-10-CM | POA: Diagnosis not present

## 2023-09-29 DIAGNOSIS — S52531D Colles' fracture of right radius, subsequent encounter for closed fracture with routine healing: Secondary | ICD-10-CM | POA: Diagnosis not present

## 2023-09-29 DIAGNOSIS — M25631 Stiffness of right wrist, not elsewhere classified: Secondary | ICD-10-CM | POA: Diagnosis not present

## 2023-09-29 DIAGNOSIS — M25531 Pain in right wrist: Secondary | ICD-10-CM | POA: Diagnosis not present

## 2023-09-29 DIAGNOSIS — M6281 Muscle weakness (generalized): Secondary | ICD-10-CM | POA: Diagnosis not present

## 2023-09-30 DIAGNOSIS — S52531A Colles' fracture of right radius, initial encounter for closed fracture: Secondary | ICD-10-CM | POA: Diagnosis not present

## 2023-10-01 DIAGNOSIS — M25631 Stiffness of right wrist, not elsewhere classified: Secondary | ICD-10-CM | POA: Diagnosis not present

## 2023-10-01 DIAGNOSIS — M6281 Muscle weakness (generalized): Secondary | ICD-10-CM | POA: Diagnosis not present

## 2023-10-01 DIAGNOSIS — S52531D Colles' fracture of right radius, subsequent encounter for closed fracture with routine healing: Secondary | ICD-10-CM | POA: Diagnosis not present

## 2023-10-01 DIAGNOSIS — M25531 Pain in right wrist: Secondary | ICD-10-CM | POA: Diagnosis not present

## 2023-10-08 DIAGNOSIS — S52531D Colles' fracture of right radius, subsequent encounter for closed fracture with routine healing: Secondary | ICD-10-CM | POA: Diagnosis not present

## 2023-10-08 DIAGNOSIS — M6281 Muscle weakness (generalized): Secondary | ICD-10-CM | POA: Diagnosis not present

## 2023-10-08 DIAGNOSIS — M25531 Pain in right wrist: Secondary | ICD-10-CM | POA: Diagnosis not present

## 2023-10-08 DIAGNOSIS — M25631 Stiffness of right wrist, not elsewhere classified: Secondary | ICD-10-CM | POA: Diagnosis not present

## 2023-10-26 ENCOUNTER — Other Ambulatory Visit (HOSPITAL_COMMUNITY): Payer: Self-pay

## 2023-10-26 DIAGNOSIS — Z6829 Body mass index (BMI) 29.0-29.9, adult: Secondary | ICD-10-CM | POA: Diagnosis not present

## 2023-10-26 DIAGNOSIS — Z713 Dietary counseling and surveillance: Secondary | ICD-10-CM | POA: Diagnosis not present

## 2023-10-26 MED ORDER — ZEPBOUND 2.5 MG/0.5ML ~~LOC~~ SOAJ
2.5000 mg | SUBCUTANEOUS | 0 refills | Status: AC
  Filled 2023-10-26 – 2023-11-08 (×5): qty 2, 28d supply, fill #0

## 2023-10-27 DIAGNOSIS — L719 Rosacea, unspecified: Secondary | ICD-10-CM | POA: Diagnosis not present

## 2023-10-27 DIAGNOSIS — D225 Melanocytic nevi of trunk: Secondary | ICD-10-CM | POA: Diagnosis not present

## 2023-10-27 DIAGNOSIS — L814 Other melanin hyperpigmentation: Secondary | ICD-10-CM | POA: Diagnosis not present

## 2023-10-27 DIAGNOSIS — D485 Neoplasm of uncertain behavior of skin: Secondary | ICD-10-CM | POA: Diagnosis not present

## 2023-11-02 ENCOUNTER — Other Ambulatory Visit (HOSPITAL_COMMUNITY): Payer: Self-pay

## 2023-11-08 ENCOUNTER — Other Ambulatory Visit (HOSPITAL_BASED_OUTPATIENT_CLINIC_OR_DEPARTMENT_OTHER): Payer: Self-pay

## 2023-11-08 ENCOUNTER — Other Ambulatory Visit (HOSPITAL_COMMUNITY): Payer: Self-pay

## 2024-01-03 DIAGNOSIS — Z713 Dietary counseling and surveillance: Secondary | ICD-10-CM | POA: Diagnosis not present

## 2024-01-04 ENCOUNTER — Other Ambulatory Visit: Payer: Self-pay | Admitting: Family

## 2024-01-04 DIAGNOSIS — Z1231 Encounter for screening mammogram for malignant neoplasm of breast: Secondary | ICD-10-CM

## 2024-01-14 ENCOUNTER — Encounter

## 2024-01-31 ENCOUNTER — Ambulatory Visit
Admission: RE | Admit: 2024-01-31 | Discharge: 2024-01-31 | Disposition: A | Source: Ambulatory Visit | Attending: Family | Admitting: Family

## 2024-01-31 DIAGNOSIS — Z1231 Encounter for screening mammogram for malignant neoplasm of breast: Secondary | ICD-10-CM | POA: Diagnosis not present
